# Patient Record
Sex: Male | Born: 1985
Health system: Southern US, Community
[De-identification: ages and names within clinical notes are randomized; demographics above are authoritative.]

---

## 2018-03-31 ENCOUNTER — Other Ambulatory Visit: Payer: Self-pay | Admitting: Internal Medicine

## 2018-04-27 MED ORDER — TRIAMCINOLONE ACETONIDE 0.5 % EX OINT: 1.0000 "application " | TOPICAL_OINTMENT | Freq: Two times a day (BID) | CUTANEOUS | 0 refills | Status: DC

## 2019-08-01 ENCOUNTER — Other Ambulatory Visit: Payer: Self-pay

## 2019-08-01 ENCOUNTER — Encounter: Payer: Self-pay | Admitting: Nurse Practitioner

## 2019-08-01 ENCOUNTER — Ambulatory Visit: Payer: 59 | Admitting: Nurse Practitioner

## 2019-08-01 VITALS — BP 122/80 | HR 74 | Temp 97.7°F | Ht 67.2 in | Wt 166.4 lb

## 2019-08-01 DIAGNOSIS — E7800 Pure hypercholesterolemia, unspecified: Secondary | ICD-10-CM | POA: Diagnosis not present

## 2019-08-01 DIAGNOSIS — Z0000 Encounter for general adult medical examination without abnormal findings: Secondary | ICD-10-CM | POA: Diagnosis not present

## 2019-08-01 DIAGNOSIS — Z13228 Encounter for screening for other metabolic disorders: Secondary | ICD-10-CM

## 2019-08-01 DIAGNOSIS — Z113 Encounter for screening for infections with a predominantly sexual mode of transmission: Secondary | ICD-10-CM

## 2019-08-01 NOTE — Patient Instructions (Signed)

## 2019-08-01 NOTE — Progress Notes (Addendum)
This visit occurred during the SARS-CoV-2 public health emergency.  Safety protocols were in place, including screening questions prior to the visit, additional usage of staff PPE, and extensive cleaning of exam room while observing appropriate contact time as indicated for disinfecting solutions.  Subjective:     Patient ID: Cameron Cook , male    DOB: 1986/01/22 , 34 y.o.   MRN: [id number]   Chief Complaint  Patient presents with  . Establish Care  . Hyperlipidemia    patient stated he donated blood and was told he had high cholesterol so he would like it to be checked     HPI  Here to establish care - he has not had a PCP (Dr. Creola Corn - in [place]) has been in the area for 2 years.  Married, no children. 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    PMH - eczema  He was donating blood recently and was told his cholesterol was high. Graph he showed his cholesterol levels ranged from 200-240 for total cholesterol.    [facility] - mother - hyperlipidemia.  Father - allergies, eczema and allergies.  2 sisters - with eczema.    Here for HM  Hyperlipidemia This is a new problem. This is a new diagnosis. The problem is uncontrolled. There are no known factors aggravating his hyperlipidemia. Pertinent negatives include no chest pain.     History reviewed. No pertinent past medical history.   Family History  Problem Relation Age of Onset  . Hyperlipidemia Mother   . Asthma Father   . Eczema Father   . Healthy Sister   . Healthy Sister   . Cancer Maternal Grandmother   . Dementia Maternal Grandfather   . Renal Disease Paternal Grandmother   . Healthy Paternal Grandfather      Current Outpatient Medications:  .  triamcinolone ointment (KENALOG) 0.5 %, Apply 1 application topically 2 (two) times daily., Disp: 30 g, Rfl: 0   Allergies  Allergen Reactions  . Penicillins Rash     Review of Systems  Constitutional: Negative.   Respiratory: Negative.   Cardiovascular: Negative.   Negative for chest pain, palpitations and leg swelling.  Gastrointestinal: Negative.   Musculoskeletal: Negative.   Skin: Negative.   Neurological: Negative.  Negative for dizziness and headaches.  Psychiatric/Behavioral: Negative.      Today's Vitals   08/01/19 1027  BP: 122/80  Pulse: 74  Temp: 97.7 F (36.5 C)  TempSrc: Oral  SpO2: 99%  Weight: 166 lb 6.4 oz (75.5 kg)  Height: 5' 7.2" (1.707 m)  PainSc: 0-No pain   Body mass index is 25.91 kg/m.   Objective:  Physical Exam Vitals reviewed.  Constitutional:      Appearance: Normal appearance. He is obese.  HENT:     Head: Normocephalic and atraumatic.     Right Ear: Tympanic membrane, ear canal and external ear normal. There is no impacted cerumen.     Left Ear: Tympanic membrane, ear canal and external ear normal. There is no impacted cerumen.  Eyes:     Extraocular Movements: Extraocular movements intact.     Conjunctiva/sclera: Conjunctivae normal.     Pupils: Pupils are equal, round, and reactive to light.  Cardiovascular:     Rate and Rhythm: Normal rate and regular rhythm.     Pulses: Normal pulses.     Heart sounds: Normal heart sounds. No murmur.  Pulmonary:     Effort: Pulmonary effort is normal. No respiratory distress.  Breath sounds: Normal breath sounds.  Abdominal:     General: Abdomen is flat. Bowel sounds are normal. There is no distension.     Palpations: Abdomen is soft. There is no mass.  Genitourinary:    Prostate: Normal.     Rectum: Guaiac result negative.  Musculoskeletal:        General: Normal range of motion.     Cervical back: Normal range of motion and neck supple.  Skin:    General: Skin is warm.     Capillary Refill: Capillary refill takes less than 2 seconds.  Neurological:     General: No focal deficit present.     Mental Status: He is alert and oriented to person, place, and time. Mental status is at baseline.  Psychiatric:        Mood and Affect: Mood normal.         Behavior: Behavior normal.        Thought Content: Thought content normal.        Judgment: Judgment normal.         Assessment And Plan:     1. Encounter for screening for metabolic disorder . Behavior modifications discussed and diet history reviewed.   . Pt will continue to exercise regularly and modify diet with low GI, plant based foods and decrease intake of processed foods.  . Recommend intake of daily multivitamin, Vitamin D, and calcium.  . Recommend  for preventive screenings, as well as recommend immunizations that include influenza - he will provide updated immunizations, to include TDAP - Lipid panel - CMP14+EGFR - CBC  2. Screening examination for STD (sexually transmitted disease)  - HIV antibody (with reflex)  3. Elevated cholesterol  Discussed importance of avoid fried and fatty foods  4. Routine general medical examination at a health care facility Behavior modifications discussed and diet history reviewed.   Pt will continue to exercise regularly and modify diet with low GI, plant based foods and decrease intake of processed foods.  Recommend intake of daily multivitamin, Vitamin D, and calcium.  Recommend for preventive screenings, as well as recommend immunizations that include influenza, TDAP    Minette Brine, FNP    THE PATIENT IS ENCOURAGED TO PRACTICE SOCIAL DISTANCING DUE TO THE COVID-19 PANDEMIC.

## 2019-08-02 LAB — CMP14+EGFR
ALT: 178 IU/L — ABNORMAL HIGH (ref 0–44)
AST: 67 IU/L — ABNORMAL HIGH (ref 0–40)
Albumin/Globulin Ratio: 1.4 (ref 1.2–2.2)
Albumin: 4.7 g/dL (ref 4.0–5.0)
Alkaline Phosphatase: 104 IU/L (ref 39–117)
BUN/Creatinine Ratio: 12 (ref 9–20)
BUN: 11 mg/dL (ref 6–20)
Bilirubin Total: 0.8 mg/dL (ref 0.0–1.2)
CO2: 23 mmol/L (ref 20–29)
Calcium: 9.7 mg/dL (ref 8.7–10.2)
Chloride: 102 mmol/L (ref 96–106)
Creatinine, Ser: 0.92 mg/dL (ref 0.76–1.27)
GFR calc Af Amer: 128 mL/min/{1.73_m2} (ref >59)
GFR calc non Af Amer: 110 mL/min/{1.73_m2} (ref >59)
Globulin, Total: 3.3 g/dL (ref 1.5–4.5)
Glucose: 85 mg/dL (ref 65–99)
Potassium: 4.4 mmol/L (ref 3.5–5.2)
Sodium: 140 mmol/L (ref 134–144)
Total Protein: 8 g/dL (ref 6.0–8.5)

## 2019-08-02 LAB — LIPID PANEL
Chol/HDL Ratio: 4.2 ratio (ref 0.0–5.0)
Cholesterol, Total: 241 mg/dL — ABNORMAL HIGH (ref 100–199)
HDL: 58 mg/dL (ref >39)
LDL Chol Calc (NIH): 163 mg/dL — ABNORMAL HIGH (ref 0–99)
Triglycerides: 115 mg/dL (ref 0–149)
VLDL Cholesterol Cal: 20 mg/dL (ref 5–40)

## 2019-08-02 LAB — CBC
Hematocrit: 47.4 % (ref 37.5–51.0)
Hemoglobin: 15.5 g/dL (ref 13.0–17.7)
MCH: 28.6 pg (ref 26.6–33.0)
MCHC: 32.7 g/dL (ref 31.5–35.7)
MCV: 88 fL (ref 79–97)
Platelets: 328 10*3/uL (ref 150–450)
RBC: 5.42 x10E6/uL (ref 4.14–5.80)
RDW: 12.3 % (ref 11.6–15.4)
WBC: 7.4 10*3/uL (ref 3.4–10.8)

## 2019-08-02 LAB — HIV ANTIBODY (ROUTINE TESTING W REFLEX): HIV Screen 4th Generation wRfx: NONREACTIVE

## 2019-08-18 ENCOUNTER — Other Ambulatory Visit: Payer: 59

## 2019-08-18 ENCOUNTER — Other Ambulatory Visit: Payer: Self-pay

## 2019-08-18 DIAGNOSIS — R748 Abnormal levels of other serum enzymes: Secondary | ICD-10-CM | POA: Diagnosis not present

## 2019-08-19 LAB — HEPATITIS C ANTIBODY: Hep C Virus Ab: <0.1 s/co ratio (ref 0.0–0.9)

## 2019-08-19 LAB — GAMMA GT: GGT: 157 IU/L — ABNORMAL HIGH (ref 0–65)

## 2019-08-24 NOTE — Addendum Note (Signed)
Addended by: Arnette Felts F on: 08/24/2019 02:39 PM   Modules accepted: Level of Service

## 2019-08-28 NOTE — Addendum Note (Signed)
Addended by: Arnette Felts F on: 08/28/2019 09:25 PM   Modules accepted: Level of Service

## 2019-09-02 ENCOUNTER — Ambulatory Visit
Admission: RE | Admit: 2019-09-02 | Discharge: 2019-09-02 | Disposition: A | Discharge status: Home / Self Care | Payer: 59 | Source: Ambulatory Visit | Attending: Nurse Practitioner | Admitting: Nurse Practitioner

## 2019-09-02 DIAGNOSIS — R748 Abnormal levels of other serum enzymes: Secondary | ICD-10-CM

## 2019-09-11 ENCOUNTER — Encounter: Payer: Self-pay | Admitting: Nurse Practitioner

## 2019-10-10 ENCOUNTER — Other Ambulatory Visit: Payer: Self-pay | Admitting: Nurse Practitioner

## 2019-10-10 ENCOUNTER — Encounter: Payer: Self-pay | Admitting: Nurse Practitioner

## 2019-10-10 DIAGNOSIS — N2889 Other specified disorders of kidney and ureter: Secondary | ICD-10-CM

## 2019-11-11 ENCOUNTER — Other Ambulatory Visit: Payer: Self-pay

## 2019-11-11 ENCOUNTER — Ambulatory Visit
Admission: RE | Admit: 2019-11-11 | Discharge: 2019-11-11 | Disposition: A | Discharge status: Home / Self Care | Payer: 59 | Source: Ambulatory Visit | Attending: Nurse Practitioner | Admitting: Nurse Practitioner

## 2019-11-11 DIAGNOSIS — N281 Cyst of kidney, acquired: Secondary | ICD-10-CM | POA: Diagnosis not present

## 2019-11-11 DIAGNOSIS — N2889 Other specified disorders of kidney and ureter: Secondary | ICD-10-CM

## 2019-11-11 MED ORDER — GADOBENATE DIMEGLUMINE 529 MG/ML IV SOLN
14.0000 mL | Freq: Once | INTRAVENOUS | Status: AC | PRN
  Administered 2019-11-11: 14 mL via INTRAVENOUS

## 2019-11-14 ENCOUNTER — Other Ambulatory Visit: Payer: Self-pay | Admitting: Nurse Practitioner

## 2019-11-14 DIAGNOSIS — N261 Atrophy of kidney (terminal): Secondary | ICD-10-CM

## 2019-11-14 DIAGNOSIS — N2889 Other specified disorders of kidney and ureter: Secondary | ICD-10-CM

## 2020-02-16 DIAGNOSIS — N261 Atrophy of kidney (terminal): Secondary | ICD-10-CM | POA: Diagnosis not present

## 2020-02-16 DIAGNOSIS — R748 Abnormal levels of other serum enzymes: Secondary | ICD-10-CM | POA: Diagnosis not present

## 2020-02-16 DIAGNOSIS — N2889 Other specified disorders of kidney and ureter: Secondary | ICD-10-CM | POA: Diagnosis not present

## 2020-02-16 DIAGNOSIS — E785 Hyperlipidemia, unspecified: Secondary | ICD-10-CM | POA: Diagnosis not present

## 2020-02-17 ENCOUNTER — Other Ambulatory Visit: Payer: Self-pay | Admitting: Internal Medicine

## 2020-03-26 ENCOUNTER — Ambulatory Visit: Payer: 59 | Admitting: Nurse Practitioner

## 2020-03-26 ENCOUNTER — Encounter: Payer: Self-pay | Admitting: Nurse Practitioner

## 2020-03-26 ENCOUNTER — Other Ambulatory Visit: Payer: Self-pay

## 2020-03-26 VITALS — BP 118/82 | HR 76 | Temp 98.3°F | Ht 67.2 in | Wt 160.2 lb

## 2020-03-26 DIAGNOSIS — L309 Dermatitis, unspecified: Secondary | ICD-10-CM

## 2020-03-26 DIAGNOSIS — E7800 Pure hypercholesterolemia, unspecified: Secondary | ICD-10-CM | POA: Diagnosis not present

## 2020-03-26 MED ORDER — TRIAMCINOLONE ACETONIDE 0.5 % EX OINT: 1.0000 "application " | TOPICAL_OINTMENT | Freq: Two times a day (BID) | CUTANEOUS | 5 refills | Status: AC

## 2020-03-26 NOTE — Progress Notes (Signed)
This visit occurred during the SARS-CoV-2 public health emergency.  Safety protocols were in place, including screening questions prior to the visit, additional usage of staff PPE, and extensive cleaning of exam room while observing appropriate contact time as indicated for disinfecting solutions.  Subjective:     Patient ID: Cameron Cook , male    DOB: 05-Dec-1985 , 34 y.o.   MRN: [id number]   Chief Complaint  Patient presents with  . 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   Needs a refill on his triamcinilone cream has rash behind knees and elbows and neck area.      History reviewed. No pertinent past medical history.   Family History  Problem Relation Age of Onset  . Hyperlipidemia Mother   . Asthma Father   . Eczema Father   . Healthy Sister   . Healthy Sister   . Cancer Maternal Grandmother   . Dementia Maternal Grandfather   . Renal Disease Paternal Grandmother   . Healthy Paternal Grandfather      Current Outpatient Medications:  .  triamcinolone ointment (KENALOG) 0.5 %, Apply 1 application topically 2 (two) times daily., Disp: 30 g, Rfl: 5   Allergies  Allergen Reactions  . Penicillins Rash     Review of Systems  Constitutional: Negative.   Respiratory: Negative.   Cardiovascular: Negative.  Negative for chest pain, palpitations and leg swelling.  Skin: Positive for rash (both back of knees and crease of elbows history of eczema and neck).  Neurological: Negative for dizziness and headaches.  Psychiatric/Behavioral: Negative.      Today's Vitals   03/26/20 1411  BP: 118/82  Pulse: 76  Temp: 98.3 F (36.8 C)  TempSrc: Oral  Weight: 160 lb 3.2 oz (72.7 kg)  Height: 5' 7.2" (1.707 m)  PainSc: 0-No pain   Body mass index is 24.94 kg/m.   Objective:  Physical Exam Constitutional:      General: He is not in acute  distress. Cardiovascular:     Rate and Rhythm: Normal rate and regular rhythm.     Pulses: Normal pulses.     Heart sounds: Normal heart sounds. No murmur heard.   Pulmonary:     Effort: Pulmonary effort is normal. No respiratory distress.     Breath sounds: Normal breath sounds.  Neurological:     General: No focal deficit present.     Mental Status: He is alert and oriented to person, place, and time.     Cranial Nerves: No cranial nerve deficit.  Psychiatric:        Mood and Affect: Mood normal.        Behavior: Behavior normal.        Thought Content: Thought content normal.        Judgment: Judgment normal.         Assessment And Plan:     1. Eczema, unspecified type  Will refill triamcinolone, has few dry patches of skin to folds and neck - triamcinolone ointment (KENALOG) 0.5 %; Apply 1 application topically 2 (two) times daily.  Dispense: 30 g; Refill: 5  2. Elevated cholesterol  Will recheck levels this visit  Encouraged to limit intake of fried and fatty foods when possible.  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 Patient verbalized understanding of the plan and was able to repeat key elements of the plan. All questions were answered to their satisfaction.   Jeanell Sparrow, FNP, have reviewed all documentation for this visit. The documentation on 03/26/20 for the exam, diagnosis, procedures, and orders are all accurate and complete.  THE PATIENT IS ENCOURAGED TO PRACTICE SOCIAL DISTANCING DUE TO THE COVID-19 PANDEMIC.

## 2020-03-27 LAB — LIPID PANEL
Chol/HDL Ratio: 4.6 ratio (ref 0.0–5.0)
Cholesterol, Total: 278 mg/dL — ABNORMAL HIGH (ref 100–199)
HDL: 61 mg/dL (ref >39)
LDL Chol Calc (NIH): 191 mg/dL — ABNORMAL HIGH (ref 0–99)
Triglycerides: 145 mg/dL (ref 0–149)
VLDL Cholesterol Cal: 26 mg/dL (ref 5–40)

## 2020-08-06 ENCOUNTER — Encounter: Payer: 59 | Admitting: Nurse Practitioner

## 2021-10-30 IMAGING — MR MR ABDOMEN WO/W CM
17 series · 48 of 48 positions shown · IV contrast (14 ml multihance)
Comparison: 09/02/2019 abdominal sonogram.

CLINICAL DATA: Indeterminate lower right renal lesion on recent
ultrasound are

EXAM:
MRI ABDOMEN WITHOUT AND WITH CONTRAST
TECHNIQUE: Multiplanar multisequence MR imaging of the abdomen was performed
both before and after the administration of intravenous contrast.
CONTRAST:  14mL MULTIHANCE GADOBENATE DIMEGLUMINE 529 MG/ML IV SOLN

[Series 3: T2 · coronal · 5.0mm · 0.78mm/px · 1 of 20 slices shown (1 of 3)]
[im 1/20]
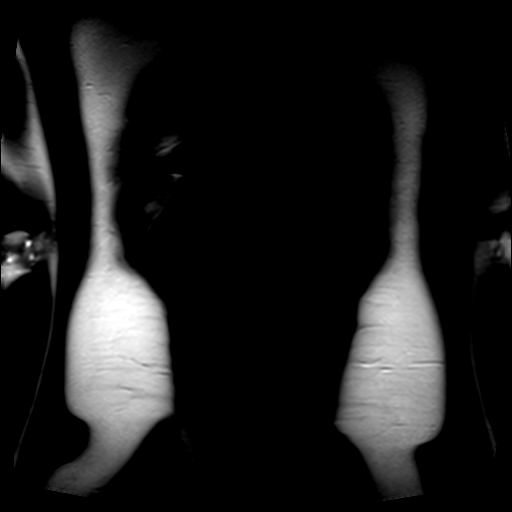

[Series 4: T2 · axial · 5.0mm · 0.66mm/px · 1 of 34 slices shown (2 of 3)]
[im 1/34]
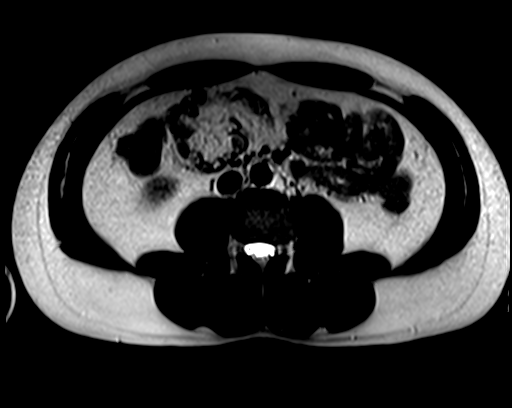

[Series 5: [id number]_trig · axial · 6.0mm · 1.98mm/px · z∈[-116,+110]mm · 3 of 90 slices shown]
[im 1/90]
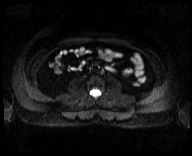
[im 45/90]
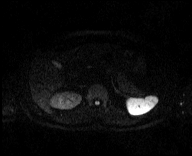
[im 90/90]
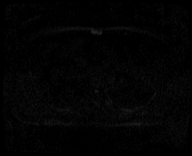

[Series 6: [id number]_trig_adc · axial · 6.0mm · 1.98mm/px · 1 of 30 slices shown]
[im 1/30]
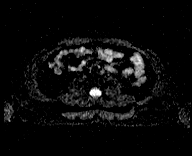

[Series 7: T2 · axial · 5.0mm · 1.41mm/px · 1 of 37 slices shown (3 of 3)]
[im 1/37]
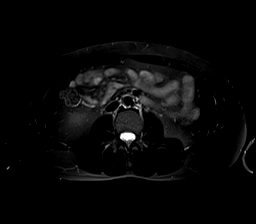

[Series 8: bSSFP · axial · 4.0mm · 0.70mm/px · 1 of 50 slices shown]
[im 1/50]
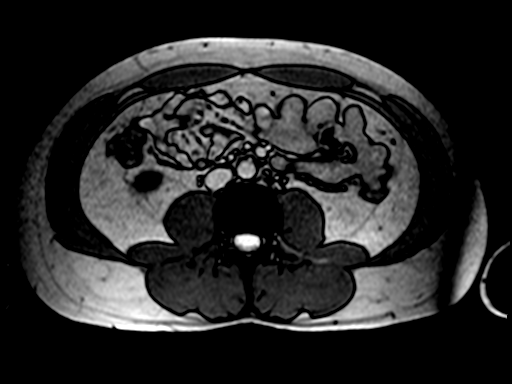

[Series 9: T1 · axial · 5.0mm · 0.70mm/px · z∈[-145,-7]mm · 2 of 48 slices shown]
[im 1/48]
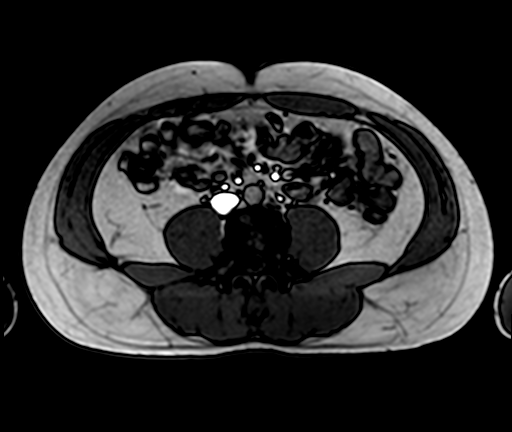
[im 48/48]
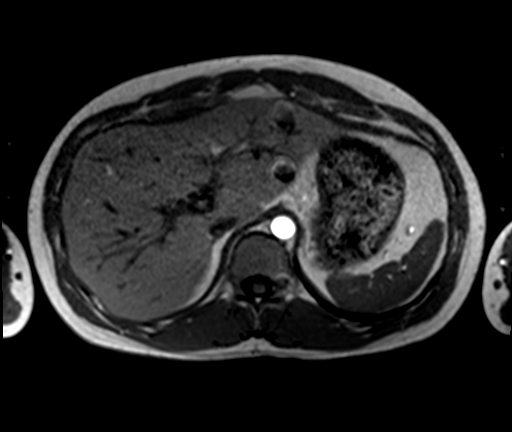

[Series 10: T1 dynamic · axial · non-contrast · 2.3mm · 1.41mm/px · z∈[-140,+42]mm · 4 of 80 slices shown]
[im 1/80]
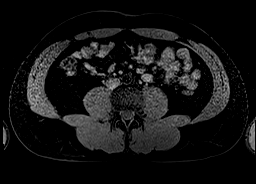
[im 27/80]
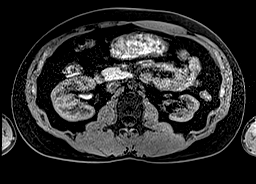
[im 53/80]
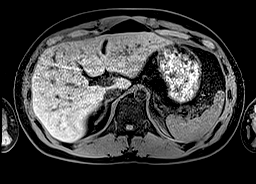
[im 80/80]
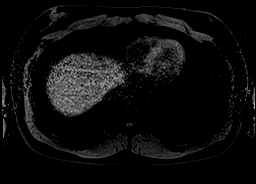

[Series 11: post 25 sec · axial · 2.3mm · 1.41mm/px · z∈[-140,+42]mm · 4 of 80 slices shown]
[im 1/80]
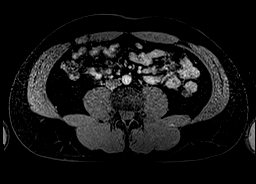
[im 27/80]
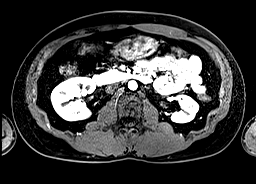
[im 53/80]
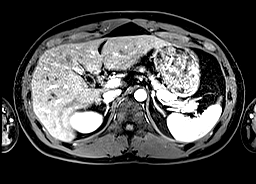
[im 80/80]
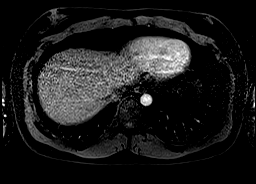

[Series 12: post 25 sec_sub · axial · 2.3mm · 1.41mm/px · z∈[-140,+42]mm · 4 of 80 slices shown]
[im 1/80]
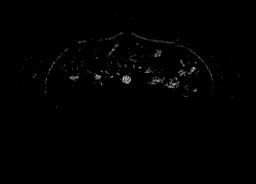
[im 27/80]
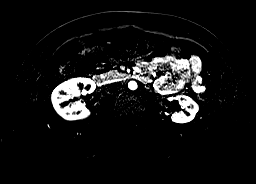
[im 53/80]
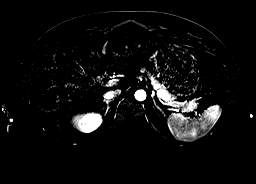
[im 80/80]
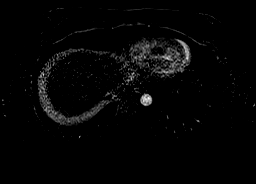

[Series 13: post 45 sec · axial · 2.3mm · 1.41mm/px · z∈[-140,+42]mm · 4 of 80 slices shown]
[im 1/80]
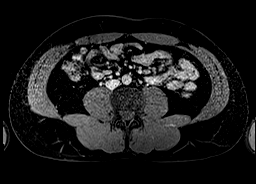
[im 27/80]
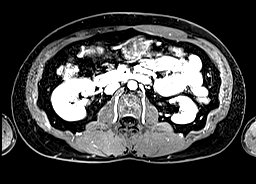
[im 53/80]
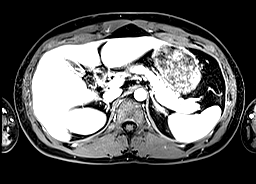
[im 80/80]
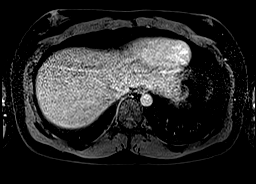

[Series 14: post 45 sec_sub · axial · 2.3mm · 1.41mm/px · z∈[-140,+42]mm · 4 of 80 slices shown]
[im 1/80]
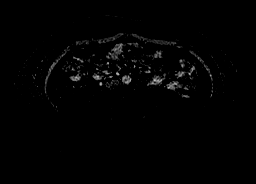
[im 27/80]
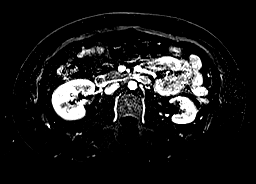
[im 53/80]
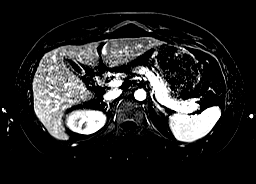
[im 80/80]
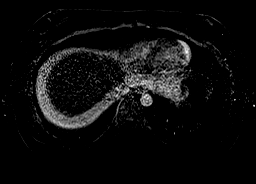

[Series 15: post 90 sec · axial · 2.3mm · 1.41mm/px · z∈[-140,+42]mm · 4 of 80 slices shown]
[im 1/80]
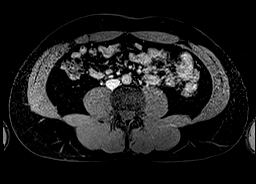
[im 27/80]
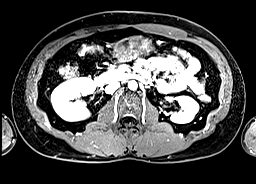
[im 53/80]
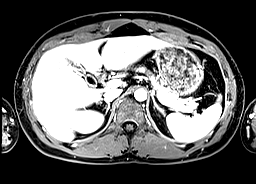
[im 80/80]
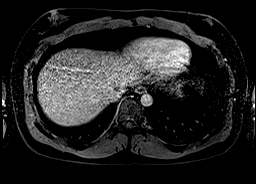

[Series 16: post 90 sec_sub · axial · 2.3mm · 1.41mm/px · z∈[-140,+42]mm · 4 of 80 slices shown]
[im 1/80]
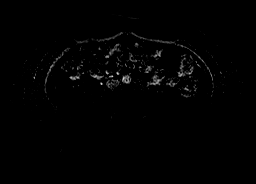
[im 27/80]
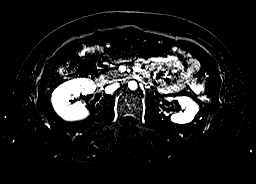
[im 53/80]
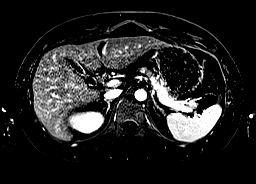
[im 80/80]
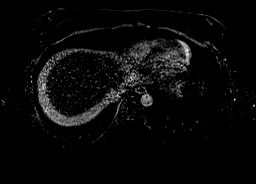

[Series 17: T1 dynamic post-contrast · coronal · 2.6mm · 0.78mm/px · 2 of 52 slices shown]
[im 1/52]
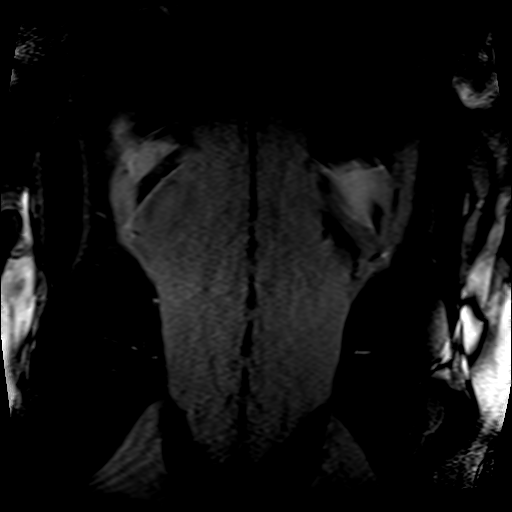
[im 52/52]
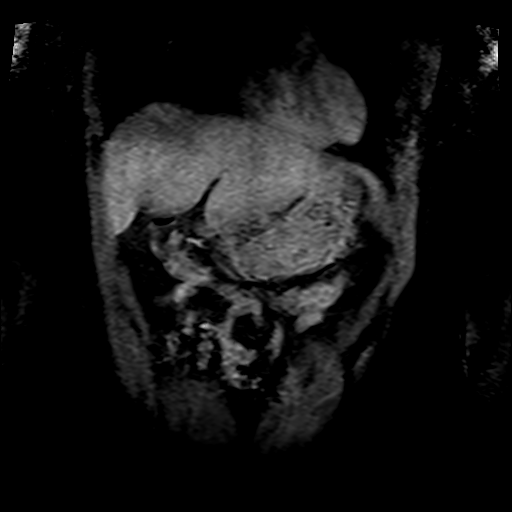

[Series 18: post axial 3+ · axial · 2.3mm · 1.41mm/px · z∈[-140,+42]mm · 4 of 80 slices shown (1 of 2)]
[im 1/80]
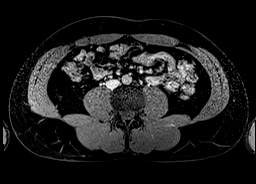
[im 27/80]
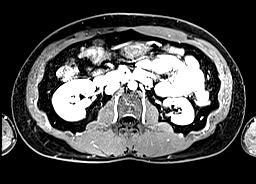
[im 53/80]
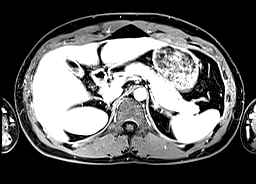
[im 80/80]
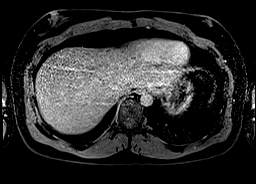

[Series 19: post axial 3+ · axial · 2.3mm · 1.41mm/px · z∈[-140,+42]mm · 4 of 80 slices shown (2 of 2)]
[im 1/80]
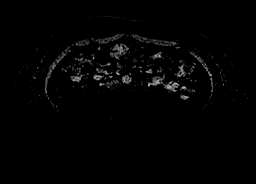
[im 27/80]
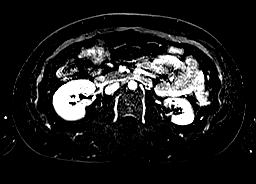
[im 53/80]
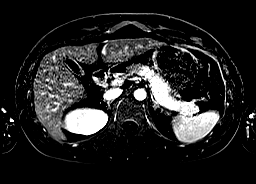
[im 80/80]
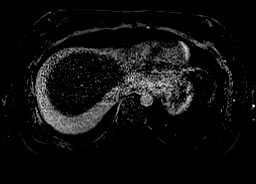

[48 of 48 positions shown; findings below may reference images not displayed]

FINDINGS: Lower chest: No acute abnormality at the lung bases.

Hepatobiliary: Normal liver size and configuration. No hepatic
steatosis. No liver mass. Normal gallbladder with no cholelithiasis.
No biliary ductal dilatation. Common bile duct diameter 4 mm. No
choledocholithiasis. No biliary masses, strictures or beading.

Pancreas: No pancreatic mass or duct dilation.  No pancreas divisum.

Spleen: Normal size. No mass.

Adrenals/Urinary Tract: Normal adrenals. No hydronephrosis.
Asymmetric moderate left renal atrophy. Normal size right kidney.
Minimally complex 1.1 x 1.1 cm renal cortical cyst in lower right
kidney (series 18/image 59) with thin internal septation and no
solid enhancement, compatible with a Bosniak category 2 renal cyst.
No suspicious renal masses.

Stomach/Bowel: Normal non-distended stomach. Visualized small and
large bowel is normal caliber, with no bowel wall thickening.

Vascular/Lymphatic: Normal caliber abdominal aorta. Patent portal,
splenic, hepatic and renal veins. No pathologically enlarged lymph
nodes in the abdomen.

Other: No abdominal ascites or focal fluid collection.

Musculoskeletal: No aggressive appearing focal osseous lesions.
IMPRESSION: 1. Minimally complex Bosniak category 2 renal cortical cyst in the
lower right kidney. No suspicious renal masses.
2. Asymmetric moderate left renal atrophy.
3. Otherwise normal MRI abdomen.
